# Patient Record
Sex: Male | Born: 1985 | Race: Black or African American | Hispanic: No | Marital: Single | Smoking: Light tobacco smoker
Health system: Southern US, Community
[De-identification: ages and names within clinical notes are randomized; demographics above are authoritative.]

## PROBLEM LIST (undated history)

## (undated) DIAGNOSIS — T7840XA Allergy, unspecified, initial encounter: Secondary | ICD-10-CM

## (undated) HISTORY — PX: FRACTURE SURGERY: SHX138

## (undated) HISTORY — PX: SHOULDER ARTHROSCOPY W/ SUPERIOR LABRAL ANTERIOR POSTERIOR LESION REPAIR: SHX2402

## (undated) HISTORY — PX: TONSILECTOMY, ADENOIDECTOMY, BILATERAL MYRINGOTOMY AND TUBES: SHX2538

## (undated) HISTORY — DX: Allergy, unspecified, initial encounter: T78.40XA

## (undated) HISTORY — PX: KNEE ARTHROSCOPY: SUR90

---

## 2013-02-17 ENCOUNTER — Ambulatory Visit (INDEPENDENT_AMBULATORY_CARE_PROVIDER_SITE_OTHER): Payer: 59 | Admitting: Physician Assistant

## 2013-02-17 VITALS — BP 102/64 | HR 90 | Temp 98.1°F | Resp 16 | Ht 70.0 in | Wt 169.0 lb

## 2013-02-17 DIAGNOSIS — Z111 Encounter for screening for respiratory tuberculosis: Secondary | ICD-10-CM

## 2013-02-17 NOTE — Progress Notes (Signed)
  Subjective:    Patient ID: Shelia Media, male    DOB: 12-Oct-1985, 27 y.o.   MRN: [id number]  HPI   Mr. Boudoin is a very pleasant 27 yr old male here for TB skin test.  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  Has had skin tests in the past, never positive, most recent in 2012.  Pt is in the process of applying to medical school.  Feels well today, no fever, chills, sweats, cough.   Review of Systems  All other systems reviewed and are negative.       Objective:   Physical Exam  Vitals reviewed. Constitutional: He is oriented to person, place, and time. He appears well-developed and well-nourished. No distress.  HENT:  Head: Normocephalic and atraumatic.  Eyes: Conjunctivae are normal. No scleral icterus.  Cardiovascular: Normal rate, regular rhythm and normal heart sounds.  Exam reveals no gallop and no friction rub.   No murmur heard. Pulmonary/Chest: Effort normal and breath sounds normal. He has no wheezes. He has no rales.  Neurological: He is alert and oriented to person, place, and time.  Skin: Skin is warm and dry.  Psychiatric: He has a normal mood and affect. His behavior is normal.     Filed Vitals:   02/17/13 1511  BP: 102/64  Pulse: 90  Temp: 98.1 F (36.7 C)  Resp: 16        Assessment & Plan:  Screening for tuberculosis - Plan: TB Skin Test   Mr. Courville is a very pleasant 27 yr old male here for TB skin test.  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.  TST placed.  Discussed with pt that he will need to RTC in 48-72 hours for read.  Pt expresses understanding and will return.

## 2013-02-17 NOTE — Patient Instructions (Addendum)
Return in 48-72 hours to have your test read.  We are open 8am-6pm both Saturday and Sunday.   Tuberculin Skin Test The PPD skin test is a method used to help with the diagnosis of a disease called tuberculosis (TB). HOW THE TEST IS DONE  The test site (usually the forearm) is cleansed. The PPD extract is then injected under the top layer of skin, causing a blister to form on the skin. The reaction will take 48 - 72 hours to develop. You must return to your health care provider within that time to have the area checked. This will determine whether you have had a significant reaction to the PPD test. A reaction is measured in millimeters of hard swelling (induration) at the site. PREPARATION FOR TEST  There is no special preparation for this test. People with a skin rash or other skin irritations on their arms may need to have the test performed at a different spot on the body. Tell your health care provider if you have ever had a positive PPD skin test. If so, you should not have a repeat PPD test. Tell your doctor if you have a medical condition or if you take certain drugs, such as steroids, that can affect your immune system. These situations may lead to inaccurate test results. NORMAL FINDINGS A negative reaction (no induration) or a level of hard swelling that falls below a certain cutoff may mean that a person has not been infected with the bacteria that cause TB. There are different cutoffs for children, people with HIV, and other risk groups. Unfortunately, this is not a perfect test, and up to 20% of people infected with tuberculosis may not have a reaction on the PPD skin test. In addition, certain conditions that affect the immune system (cancer, recent chemotherapy, late-stage AIDS) may cause a false-negative test result.  The reaction will take 48 - 72 hours to develop. You must return to your health care provider within that time to have the area checked. Follow your caregiver's  instructions as to where and when to report for this to be done. Ranges for normal findings may vary among different laboratories and hospitals. You should always check with your doctor after having lab work or other tests done to discuss the meaning of your test results and whether your values are considered within normal limits. WHAT ABNORMAL RESULTS MEAN  The results of the test depend on the size of the skin reaction and on the person being tested.  A small reaction (5 mm of hard swelling at the site) is considered to be positive in people who have HIV, who are taking steroid therapy, or who have been in close contact with a person who has active tuberculosis. Larger reactions (greater than or equal to 10 mm) are considered positive in people with diabetes or kidney failure, and in health care workers, among others. In people with no known risks for tuberculosis, a positive reaction requires 15 mm or more of hard swelling at the site. RISKS AND COMPLICATIONS There is a very small risk of severe redness and swelling of the arm in people who have had a previous positive PPD test and who have the test again. There also have been a few rare cases of this reaction in people who have not been tested before. CONSIDERATIONS  A positive skin test does not necessarily mean that a person has active tuberculosis. More tests will be done to check whether active disease is present. Many people who  were born outside the [place] may have had a vaccine called "BCG," which can lead to a false-positive test result. MEANING OF TEST  Your caregiver will go over the test results with you and discuss the importance and meaning of your results, as well as treatment options and the need for additional tests if necessary. OBTAINING THE TEST RESULTS It is your responsibility to obtain your test results. Ask the lab or department performing the test when and how you will get your results. Document Released: 07/03/2005  Document Revised: 12/16/2011 Document Reviewed: 09/04/2008 [facility] Patient Information 2013 [place], [place].

## 2013-03-24 ENCOUNTER — Ambulatory Visit (INDEPENDENT_AMBULATORY_CARE_PROVIDER_SITE_OTHER): Payer: 59 | Admitting: Physician Assistant

## 2013-03-24 VITALS — BP 111/79 | HR 76 | Temp 97.5°F | Resp 17 | Ht 68.5 in | Wt 167.0 lb

## 2013-03-24 DIAGNOSIS — Z111 Encounter for screening for respiratory tuberculosis: Secondary | ICD-10-CM

## 2013-03-24 NOTE — Progress Notes (Signed)
  Subjective:    Patient ID: Christian Farmer, male    DOB: 1986/04/24, 27 y.o.   MRN: [id number]  HPI 27 year old male here for TB test. 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 Had one placed in 02/2013 but did not come back to have it read. Will place again today.  Patient otherwise doing well with no other concerns today.     Review of Systems  Constitutional: Negative for fever and chills.  Respiratory: Negative for cough and shortness of breath.   Gastrointestinal: Negative for nausea and vomiting.  Neurological: Negative for headaches.       Objective:   Physical Exam  Constitutional: He is oriented to person, place, and time. He appears well-developed and well-nourished.  HENT:  Head: Normocephalic and atraumatic.  Right Ear: External ear normal.  Left Ear: External ear normal.  Eyes: Conjunctivae are normal.  Cardiovascular: Normal rate.   Pulmonary/Chest: Effort normal.  Neurological: He is alert and oriented to person, place, and time.  Psychiatric: He has a normal mood and affect. His behavior is normal. Judgment and thought content normal.          Assessment & Plan:  Screening for tuberculosis - Plan: TB Skin Test  TB test place. Return in 48-72 hours for read

## 2013-03-24 NOTE — Progress Notes (Signed)
  Tuberculosis Risk Questionnaire  1. Were you born outside the [place] in one of the following parts of the world: [place], [place], [place], [place] or [place]?  No  2. Have you traveled outside the [place] and lived for more than one month in one of the following parts of the world: [place], [place], [place], [place] or [place]?  No  3. Do you have a compromised immune system such as from any of the following conditions:HIV/AIDS, organ or bone marrow transplantation, diabetes, immunosuppressive medicines (e.g. Prednisone, Remicaide), leukemia, lymphoma, cancer of the head or neck, gastrectomy or jejunal bypass, end-stage renal disease (on dialysis), or silicosis?  No   4. Have you ever or do you plan on working in: a residential care center, a health care facility, a jail or prison or homeless shelter?  Yes - plans to attend medical school  5. Have you ever: injected illegal drugs, used crack cocaine, lived in a homeless shelter  or been in jail or prison?   No  6. Have you ever been exposed to anyone with infectious tuberculosis?  No   Tuberculosis Symptom Questionnaire  Do you currently have any of the following symptoms?  1. Unexplained cough lasting more than 3 weeks? No  2. Unexplained fever lasting more than 3 weeks. No   3. Night Sweats (sweating that leaves the bedclothes and sheets wet)   No  4. Shortness of Breath No  5. Chest Pain No  6. Unintentional weight loss  No  7. Unexplained fatigue (very tired for no reason) No

## 2013-03-26 ENCOUNTER — Ambulatory Visit (INDEPENDENT_AMBULATORY_CARE_PROVIDER_SITE_OTHER): Payer: 59 | Admitting: Radiology

## 2013-03-26 VITALS — BP 114/68

## 2013-03-26 DIAGNOSIS — Z111 Encounter for screening for respiratory tuberculosis: Secondary | ICD-10-CM

## 2013-03-26 NOTE — Progress Notes (Signed)
  Tuberculosis Risk Questionnaire  1. Were you born outside the [place] in one of the following parts of the world: [place], [place], [place], [place] or [place]?  No  2. Have you traveled outside the [place] and lived for more than one month in one of the following parts of the world: [place], [place], [place], [place] or Eastern [place]?  No  3. Do you have a compromised immune system such as from any of the following conditions:HIV/AIDS, organ or bone marrow transplantation, diabetes, immunosuppressive medicines (e.g. Prednisone, Remicaide), leukemia, lymphoma, cancer of the head or neck, gastrectomy or jejunal bypass, end-stage renal disease (on dialysis), or silicosis?  No   4. Have you ever or do you plan on working in: a residential care center, a health care facility, a jail or prison or homeless shelter?  Yes   5. Have you ever: injected illegal drugs, used crack cocaine, lived in a homeless shelter  or been in jail or prison?   No  6. Have you ever been exposed to anyone with infectious tuberculosis?  No   Tuberculosis Symptom Questionnaire  Do you currently have any of the following symptoms?  1. Unexplained cough lasting more than 3 weeks? No  2. Unexplained fever lasting more than 3 weeks. No   3. Night Sweats (sweating that leaves the bedclothes and sheets wet)   No  4. Shortness of Breath No  5. Chest Pain No  6. Unintentional weight loss  No  7. Unexplained fatigue (very tired for no reason) No  

## 2013-05-06 ENCOUNTER — Ambulatory Visit: Payer: 59

## 2013-05-06 ENCOUNTER — Ambulatory Visit (INDEPENDENT_AMBULATORY_CARE_PROVIDER_SITE_OTHER): Payer: 59 | Admitting: Internal Medicine

## 2013-05-06 VITALS — BP 112/78 | HR 73 | Temp 98.6°F | Resp 17 | Ht 70.0 in | Wt 159.0 lb

## 2013-05-06 DIAGNOSIS — M25571 Pain in right ankle and joints of right foot: Secondary | ICD-10-CM

## 2013-05-06 DIAGNOSIS — S93609A Unspecified sprain of unspecified foot, initial encounter: Secondary | ICD-10-CM

## 2013-05-06 DIAGNOSIS — M25579 Pain in unspecified ankle and joints of unspecified foot: Secondary | ICD-10-CM

## 2013-05-06 DIAGNOSIS — S96912A Strain of unspecified muscle and tendon at ankle and foot level, left foot, initial encounter: Secondary | ICD-10-CM

## 2013-05-06 MED ORDER — IBUPROFEN 600 MG PO TABS: 600.0000 mg | ORAL_TABLET | Freq: Three times a day (TID) | ORAL | Status: DC | PRN

## 2013-05-06 NOTE — Patient Instructions (Signed)
Foot Fracture Your caregiver has diagnosed you as having a foot fracture (broken bone). Your foot has many bones. You have a fracture, or break, in one of these bones. In some cases, your doctor may put on a splint or removable fracture boot until the swelling in your foot has lessened. A cast may or may not be required. HOME CARE INSTRUCTIONS  If you do not have a cast or splint:  You may bear weight on your injured foot as tolerated or advised.  Do not put any weight on your injured foot for as long as directed by your caregiver. Slowly increase the amount of time you walk on the foot as the pain and swelling allows or as advised.  Use crutches until you can bear weight without pain. A gradual increase in weight bearing may help.  Apply ice to the injury for 15-20 minutes each hour while awake for the first 2 days. Put the ice in a plastic bag and place a towel between the bag of ice and your skin.  If an ace bandage (stretchy, elastic wrapping bandage) was applied, you may re-wrap it if ankle is more painful or your toes become cold and swollen. If you have a cast or splint:  Use your crutches for as long as directed by your caregiver.  To lessen the swelling, keep the injured foot elevated on pillows while lying down or sitting. Elevate your foot above your heart.  Apply ice to the injury for 15-20 minutes each hour while awake for the first 2 days. Put the ice in a plastic bag and place a thin towel between the bag of ice and your cast.  Plaster or fiberglass cast:  Do not try to scratch the skin under the cast using a sharp or pointed object down the cast.  Check the skin around the cast every day. You may put lotion on any red or sore areas.  Keep your cast clean and dry.  Plaster splint:  Wear the splint until you are seen for a follow-up examination.  You may loosen the elastic around the splint if your toes become numb, tingle, or turn blue or cold. Do not rest it on  anything harder than a pillow in the first 24 hours.  Do not put pressure on any part of your splint. Use your crutches as directed.  Keep your splint dry. It can be protected during bathing with a plastic bag. Do not lower the splint into water.  If you have a fracture boot you may remove it to shower. Bear weight only as instructed by your caregiver.  Only take over-the-counter or prescription medicines for pain, discomfort, or fever as directed by your caregiver. SEEK IMMEDIATE MEDICAL CARE IF:   Your cast gets damaged or breaks.  You have continued severe pain or more swelling than you did before the cast was put on.  Your skin or nails of your casted foot turn blue, gray, feel cold or numb.  There is a bad smell from your cast.  There is severe pain with movement of your toes.  There are new stains and/or drainage coming from under the cast. MAKE SURE YOU:   Understand these instructions.  Will watch your condition.  Will get help right away if you are not doing well or get worse. Document Released: 09/20/2000 Document Revised: 12/16/2011 Document Reviewed: 10/27/2008 [employer] Patient Information 2014 [employer].  

## 2013-05-06 NOTE — Progress Notes (Signed)
  Subjective:    Patient ID: Christian Farmer, male    DOB: Jun 01, 1986, 27 y.o.   MRN: [id number]  HPI Pt presents to clinic this morning with Lt foot pain. He does not recall any injury. He states yesterday he was up playing basketball with some friends.   Review of Systems     Objective:   Physical Exam        Assessment & Plan:

## 2013-05-06 NOTE — Progress Notes (Signed)
  Subjective:    Patient ID: Christian Farmer, male    DOB: [birth year], 27 y.o.   MRN: [id number]  HPI Young and healthy and active. @ days ago was aggressively dunking the BB, next am felt pain mid dorsal volar/plantar foot. Is swollen, maybe a little red. No previous injury this foot . Cannot bear weight   Review of Systems Neg.    Objective:   Physical Exam  Vitals reviewed. Constitutional: He is oriented to person, place, and time. He appears well-developed and well-nourished.  HENT:  Nose: Nose normal.  Eyes: EOM are normal.  Pulmonary/Chest: Effort normal.  Musculoskeletal: He exhibits edema and tenderness.       Left foot: He exhibits tenderness, bony tenderness and swelling. He exhibits normal range of motion, normal capillary refill, no crepitus, no deformity and no laceration.  Neurological: He is alert and oriented to person, place, and time. He has normal strength. No sensory deficit. Gait abnormal.    [facility] reading (PRIMARY) by  Dr Raelene Bott fx seen.        Assessment & Plan:  Foot strain/possible stress fx RICE/[facility]walker

## 2013-07-06 ENCOUNTER — Ambulatory Visit (INDEPENDENT_AMBULATORY_CARE_PROVIDER_SITE_OTHER): Payer: 59 | Admitting: Physician Assistant

## 2013-07-06 VITALS — BP 102/64 | HR 78 | Temp 98.1°F | Resp 20 | Ht 66.5 in | Wt 163.6 lb

## 2013-07-06 DIAGNOSIS — Z23 Encounter for immunization: Secondary | ICD-10-CM

## 2013-07-06 DIAGNOSIS — Z8619 Personal history of other infectious and parasitic diseases: Secondary | ICD-10-CM

## 2013-07-06 NOTE — Patient Instructions (Addendum)
Immunization Schedule, Adult  Influenza vaccine.  Adults should be given 1 dose every year.  Tetanus, diphtheria, and pertussis (Td, Tdap) vaccine.  Adults who have not previously been given Tdap or who do not know their vaccine status should be given 1 dose of Tdap.  Adults should have a Td booster every 10 years.  Doses should be given if needed to catch up on missed doses in the past.  Pregnant women should be given 1 dose of Tdap vaccine during each pregnancy.  Varicella vaccine.  All adults without evidence of immunity to varicella should receive 2 doses or a second dose if they have received only 1 dose.  Pregnant women who do not have evidence of immunity should be given the first dose after their pregnancy.  Human papillomavirus (HPV) vaccine.  Women aged 13 through 26 years who have not been given the vaccine previously should be given the 3 dose series. The second dose should be given 1 to 2 months after the first dose. The third dose should be given at least 24 weeks after the first dose.  The vaccine is not recommended for use in pregnant women. However, pregnancy testing is not needed before being given a dose. If a woman is found to be pregnant after being given a dose, no treatment is needed. In that case, the remaining doses should be delayed until after the pregnancy.  Men aged 13 through 21 years who have not been given the vaccine previously should be given the 3 dose series. Men aged 22 through 26 years may be given the 3 dose series. The second dose should be given 1 to 2 months after the first dose. The third dose should be given at least 24 weeks after the first dose.  Zoster vaccine.  One dose is recommended for adults aged 60 years and older unless certain conditions are present.  Measles, mumps, and rubella (MMR) vaccine.  Adults born before 1957 generally are considered immune to measles and mumps. Healthcare workers born before 1957 who do not have  evidence of immunity should consider vaccination.  Adults born in 1957 or later should have 1 or more doses of MMR vaccine unless there is a contraindication for the vaccine or they have evidence of immunity to the diseases. A second dose should be given at least 28 days after the first dose. Adults receiving certain types of previous vaccines should consider or be given vaccine doses.  For women of childbearing age, rubella immunity should be determined. If there is no evidence of immunity, women who are not pregnant should be vaccinated. If there is no evidence of immunity, women who are pregnant should delay vaccination until after their pregnancy.  Pneumococcal polysaccharide (PPSV23) vaccine.  All adults aged 65 years and older should be given 1 dose.  Adults younger than age 65 years who have certain medical conditions, who smoke cigarettes, who reside in nursing homes or long-term care facilities, or who have an unknown vaccination history should usually be given 1 or 2 doses of the vaccine.  Pneumococcal 13-valent conjugate (PVC13) vaccine.  Adults aged 19 years or older with certain medical conditions and an unknown or incomplete pneumococcal vaccination history should usually be given 1 dose of the vaccine. This dose may be in addition to a PPSV23 vaccine dose.  Meningococcal vaccine.  First-year college students up to age 21 years who are living in residence halls should be given a dose if they did not receive a dose on   or after their 16th birthday.  A dose should be given to microbiologists working with certain meningitis bacteria, military recruits, and people who travel to or live in countries with a high rate of meningitis.  One or 2 doses should be given to adults who have certain high-risk conditions.  Hepatitis A vaccine.  Adults who wish to be protected from this disease, who have certain high-risk conditions, who work with hepatitis A-infected animals, who work in  hepatitis A research labs, or who travel to or work in countries with a high rate of hepatitis A should be given the 2 dose series of the vaccine.  Adults who were previously unvaccinated and who anticipate close contact with an international adoptee during the first 60 days after arrival in the [place] from a country with a high rate of hepatitis A should be given the vaccine. The first dose of the 2 dose series should be given 2 or more weeks before the arrival of the adoptee.  Hepatitis B vaccine.  Adults who wish to be protected from this disease, who have certain high-risk conditions, who may be exposed to blood or other infectious body fluids, who are household contacts or sex partners of hepatitis B positive people, who are clients or workers in certain care facilities, or who travel to or work in countries with a high rate of hepatitis B should be given the 3 dose series of the vaccine. If you travel outside the [place], additional vaccines may be needed. The Centers for Disease Control and Prevention (CDC) provides information about the vaccines, medicines, and other measures necessary to prevent illness and injury during international travel. Visit the CDC website at www.cdc.gov/travel or call (800) CDC-INFO [800-232-4636]. You may also consult a travel clinic or your caregiver. Document Released: 12/14/2003 Document Revised: 12/16/2011 Document Reviewed: 11/08/2011 [employer] Patient Information 2014 [employer].  

## 2013-07-06 NOTE — Progress Notes (Signed)
  Subjective:    Patient ID: Christian Farmer, male    DOB: 1985-10-09, 27 y.o.   MRN: [id number]  HPI   Christian Farmer is a very pleasant 27 yr old male here requesting a varicella titer.  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  Had chicken pox as a child.  Has never had the vaccine.  Feels well today.     Review of Systems  All other systems reviewed and are negative.       Objective:   Physical Exam  Vitals reviewed. Constitutional: He is oriented to person, place, and time. He appears well-developed and well-nourished. No distress.  HENT:  Head: Normocephalic and atraumatic.  Eyes: Conjunctivae are normal. No scleral icterus.  Cardiovascular: Normal rate, regular rhythm and normal heart sounds.   Pulmonary/Chest: Effort normal and breath sounds normal. He has no wheezes. He has no rales.  Neurological: He is alert and oriented to person, place, and time.  Skin: Skin is warm and dry.  Psychiatric: He has a normal mood and affect. His behavior is normal.        Assessment & Plan:  History of varicella - Plan: Varicella zoster antibody, IgG  Need for varicella vaccine - Plan: Varicella zoster antibody, IgG   Christian Farmer is a pleasant 27 yr old male here for varicella titer for job shadowing.  Labs drawn.  Will provide results to pt when available.

## 2013-07-07 LAB — VARICELLA ZOSTER ANTIBODY, IGG: Varicella IgG: 238.6 Index — ABNORMAL HIGH (ref <135.00)

## 2013-07-08 ENCOUNTER — Telehealth: Payer: Self-pay

## 2013-07-08 NOTE — Telephone Encounter (Signed)
Lab has contacted him.

## 2013-07-08 NOTE — Telephone Encounter (Signed)
Pt is calling to get his lab results  Call back number is [phone]

## 2014-01-25 ENCOUNTER — Ambulatory Visit (INDEPENDENT_AMBULATORY_CARE_PROVIDER_SITE_OTHER): Payer: 59 | Admitting: Physician Assistant

## 2014-01-25 VITALS — BP 118/62 | HR 75 | Temp 98.0°F | Resp 16 | Ht 66.5 in | Wt 156.8 lb

## 2014-01-25 DIAGNOSIS — E291 Testicular hypofunction: Secondary | ICD-10-CM

## 2014-01-25 NOTE — Patient Instructions (Signed)
I will let you know when your testosterone level is back and when I have records from Dr. Karen Kitchensrujillo, then we will come up with a plan from there  Please let me know if you have any concerns in the mean time

## 2014-01-25 NOTE — Progress Notes (Addendum)
   Subjective:    Patient ID: Christian Farmer, male    DOB: 1986-09-21, 28 y.o.   MRN: [id number]  HPI   Christian Farmer is a very pleasant 28 yr old male here to discuss hypogonadism.  He reports a history of this diagnosed when he was about 17 due to late onset of puberty, low muscle mass.  [place] [place] [place] [facility]  Reports that his testosterone was last tested about 2007 - he moved away to go to college and was lost to follow up - has been untreated since that time.  He is in the [employer] and was undergoing a medical evaluation this past week and was recommended to have labs checked  Pt reports he is working out and trying to gain weight but cannot put on any muscle mass.  He reports low bone density - last tested when seeing Dr. Karen Kitchensrujillo.  He reports he is often tired - but thinks this is due to school.  He has normal energy.  He does monthly testicular exams and has no concerns.   Review of Systems  Constitutional: Negative.   Respiratory: Negative.   Cardiovascular: Negative.   Genitourinary: Negative.   Musculoskeletal: Negative.        Objective:   Physical Exam  Vitals reviewed. Constitutional: He is oriented to person, place, and time. He appears well-developed and well-nourished. No distress.  HENT:  Head: Normocephalic and atraumatic.  Eyes: Conjunctivae are normal. No scleral icterus.  Cardiovascular: Normal rate, regular rhythm and normal heart sounds.   Pulmonary/Chest: Effort normal and breath sounds normal. He has no wheezes. He has no rales.  Neurological: He is alert and oriented to person, place, and time.  Skin: Skin is warm and dry.  Psychiatric: He has a normal mood and affect. His behavior is normal.       Assessment & Plan:  Hypogonadism male - Plan: Testosterone, free, total   Christian Farmer is a very pleasant 28 yr old male here for evaluation of hypogonadism.  He reports this was diagnosed about 10 yrs ago due to late onset of puberty.   He [place] [facility] [place] but was lost to follow up several years ago.  He has not been treated or had labs drawn since that time.  Will check total testosterone today.  We have requested records from Dr. Karen Kitchensrujillo.  I suspect pt will need to re-initiate testosterone therapy, but will await labs and records before making a decision on treatment.    Loleta DickerE. Elizabeth Kerby Hockley MHS, PA-C [facility] 4/21/20151:31 PM   Addendum 01/26/14: Testosterone is actually WNL at 525, though this is on the lower end.  Will recheck this in about 1 wk as levels do very . Awaiting records from Dr. Karen Kitchensrujillo

## 2014-01-26 LAB — TESTOSTERONE, FREE, TOTAL, SHBG
Sex Hormone Binding: 46 nmol/L (ref 13–71)
TESTOSTERONE: 525 ng/dL (ref 300–890)
Testosterone, Free: 89.6 pg/mL (ref 47.0–244.0)
Testosterone-% Free: 1.7 % (ref 1.6–2.9)

## 2014-01-26 NOTE — Addendum Note (Signed)
Addended by: Godfrey PickEGAN, Conan Mcmanaway E on: 01/26/2014 06:17 PM   Modules accepted: Orders

## 2014-02-07 ENCOUNTER — Other Ambulatory Visit (INDEPENDENT_AMBULATORY_CARE_PROVIDER_SITE_OTHER): Payer: 59

## 2014-02-07 DIAGNOSIS — E291 Testicular hypofunction: Secondary | ICD-10-CM

## 2014-02-07 NOTE — Progress Notes (Signed)
Patient here for labs only. 

## 2014-02-08 ENCOUNTER — Other Ambulatory Visit: Payer: Self-pay | Admitting: Physician Assistant

## 2014-02-08 DIAGNOSIS — E291 Testicular hypofunction: Secondary | ICD-10-CM

## 2014-02-08 LAB — TESTOSTERONE, FREE, TOTAL, SHBG
Sex Hormone Binding: 33 nmol/L (ref 13–71)
TESTOSTERONE FREE: 51.5 pg/mL (ref 47.0–244.0)
TESTOSTERONE-% FREE: 1.9 % (ref 1.6–2.9)
TESTOSTERONE: 265 ng/dL — AB (ref 300–890)

## 2014-02-08 MED ORDER — TESTOSTERONE 20.25 MG/1.25GM (1.62%) TD GEL: TRANSDERMAL | Status: DC

## 2014-02-08 NOTE — Progress Notes (Signed)
Pt with hx hypogonadism, untreated for several years now.  Two AM testosterone 525 and 265.  Will restart Androgel, which pt has been treated with before.  Start 2 pumps daily.  Recheck testosterone in 1 month - future order placed.  Lab Results  Component Value Date   TESTOSTERONE 265* 02/07/2014

## 2014-03-21 ENCOUNTER — Other Ambulatory Visit: Payer: Self-pay | Admitting: Physician Assistant

## 2014-03-25 ENCOUNTER — Telehealth: Payer: Self-pay

## 2014-03-25 DIAGNOSIS — E291 Testicular hypofunction: Secondary | ICD-10-CM

## 2014-03-25 NOTE — Telephone Encounter (Signed)
Patient called and needs a refill on his Androgel. Informed patient that he needs an OV for a recheck on lab work before he can receive another refill. Patient understood however he asked that a leave a message for him to get an extension on this medication until hie is able to come in. Patient uses [facility] on [address]  [phone]

## 2014-03-29 MED ORDER — TESTOSTERONE 20.25 MG/1.25GM (1.62%) TD GEL: TRANSDERMAL | Status: DC

## 2014-03-29 NOTE — Telephone Encounter (Signed)
Advised pt rx ready to be picked up. Pt will come to have labs drawn before he runs out.

## 2014-03-29 NOTE — Telephone Encounter (Signed)
Rx printed.  Pt needs to come in for lab draw. There is a future order in for this, so he can come at his convenience.  No further refills though until we check his testosterone

## 2014-05-09 IMAGING — CR DG FOOT COMPLETE 3+V*L*
2 series · 2 of 2 positions shown · non-contrast
Comparison: None.

CLINICAL DATA: Painful left foot.  No known injury.

LEFT FOOT - COMPLETE 3+ VIEW

[AP]
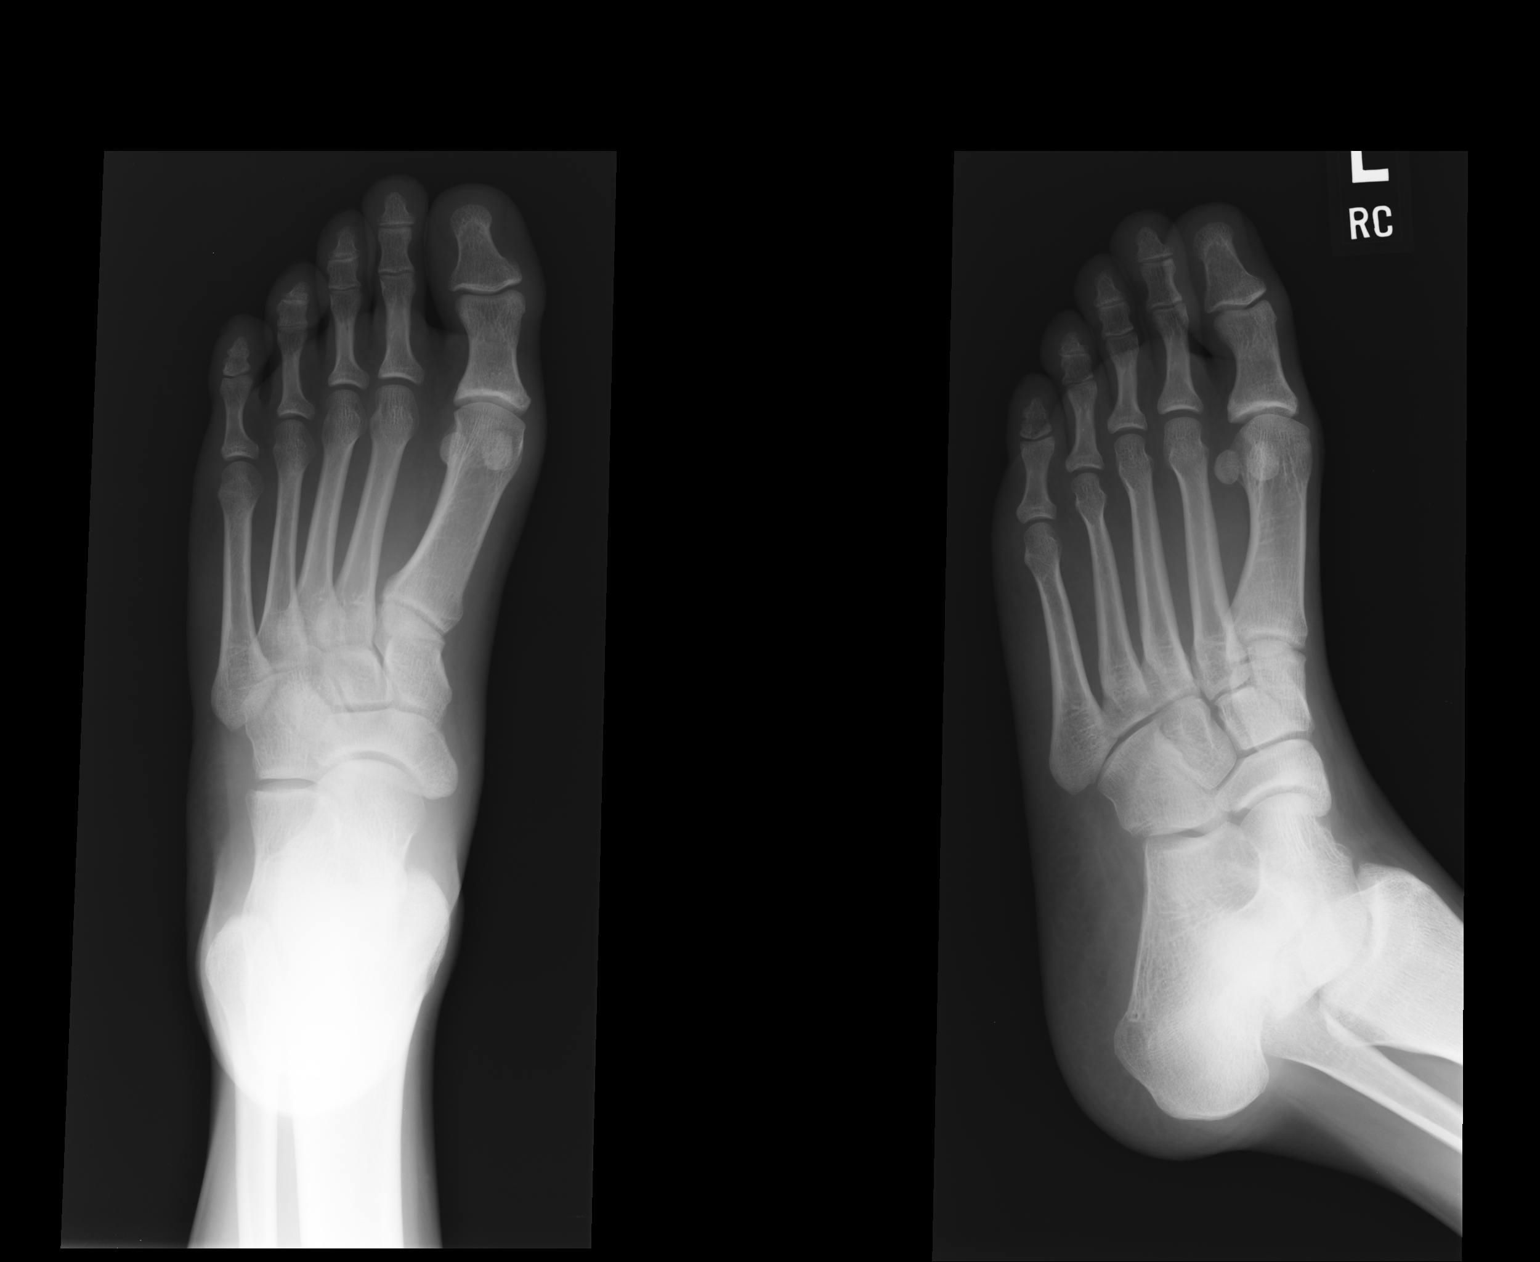

[lateral]
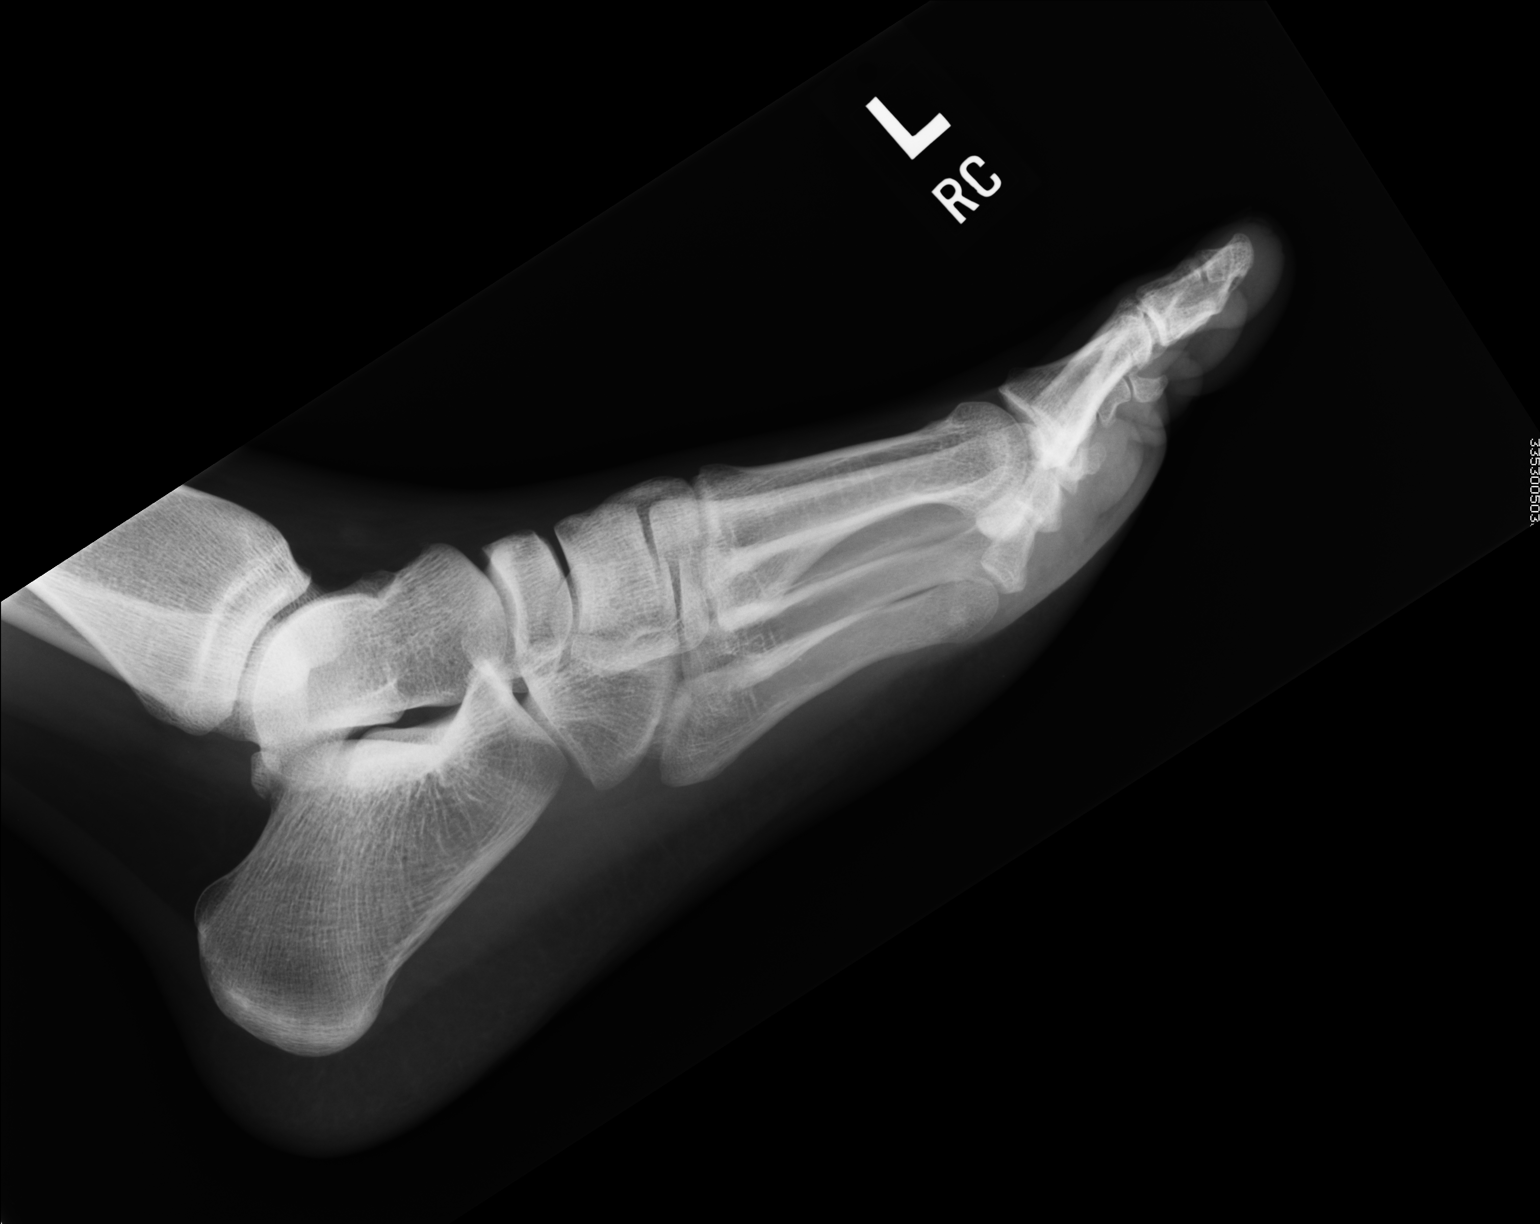

[2 of 2 positions shown; findings below may reference images not displayed]

FINDINGS: Alignment is normal.  Joint spaces are preserved.  No
fracture or dislocation is evident.  No soft tissue lesions are
seen.
IMPRESSION: No foot abnormality is evident.

Clinically significant discrepancy from primary report, if
provided: None

## 2014-05-18 ENCOUNTER — Other Ambulatory Visit: Payer: Self-pay | Admitting: *Deleted

## 2014-07-06 ENCOUNTER — Ambulatory Visit (INDEPENDENT_AMBULATORY_CARE_PROVIDER_SITE_OTHER): Payer: 59 | Admitting: Family Medicine

## 2014-07-06 VITALS — BP 112/68 | HR 70 | Temp 98.2°F | Resp 16 | Ht 70.0 in | Wt 162.0 lb

## 2014-07-06 DIAGNOSIS — E291 Testicular hypofunction: Secondary | ICD-10-CM

## 2014-07-06 DIAGNOSIS — Z8639 Personal history of other endocrine, nutritional and metabolic disease: Secondary | ICD-10-CM

## 2014-07-06 DIAGNOSIS — Z862 Personal history of diseases of the blood and blood-forming organs and certain disorders involving the immune mechanism: Secondary | ICD-10-CM

## 2014-07-06 LAB — FSH/LH
FSH: 4 m[IU]/mL (ref 1.4–18.1)
LH: 3.9 m[IU]/mL (ref 1.5–9.3)

## 2014-07-06 NOTE — Patient Instructions (Signed)
After we get to reports we will represcribed your medications. If I have any questions or concerns I will refer you to an endocrinologist.

## 2014-07-06 NOTE — Addendum Note (Signed)
Addended byAlden Benjamin: Bethanny Toelle R on: 07/06/2014 11:11 AM   Modules accepted: Orders

## 2014-07-06 NOTE — Progress Notes (Addendum)
Subjective: Patient has a history of hypogonadism. He was treated beginning in high school. His parents got divorced history discontinue. He was in the [employer] and never got this reinitiated. He came in this spring and was seen. He has had 2 levels checked, one of which was in the mid normal range at 1 which was low. He is here to get his testosterone prescribed. We had a long talk about that. He is planning to go to medical school.  Objective He has fairly small testicles. Apparently he is able to function sexually. No other major symptoms. He says he has a hard time building muscle bulk than he thinks he should.  Assessment: Hypogonadism Testosterone is some  Plan: FSH LH and testosterone levels. I wish to see these before every prescribe it. I will not be the original prescriber of it since he is been previously diagnosed, and am willing to continue it. However if I see any items of significant concern I will want to refer him to an endocrinologist for further evaluation. He understands these.

## 2014-07-07 ENCOUNTER — Other Ambulatory Visit: Payer: Self-pay | Admitting: Family Medicine

## 2014-07-07 DIAGNOSIS — E291 Testicular hypofunction: Secondary | ICD-10-CM

## 2014-07-07 LAB — TESTOSTERONE, FREE, TOTAL, SHBG
Sex Hormone Binding: 37 nmol/L (ref 13–71)
TESTOSTERONE-% FREE: 1.8 % (ref 1.6–2.9)
Testosterone, Free: 42.1 pg/mL — ABNORMAL LOW (ref 47.0–244.0)
Testosterone: 235 ng/dL — ABNORMAL LOW (ref 300–890)

## 2014-07-07 MED ORDER — TESTOSTERONE 20.25 MG/1.25GM (1.62%) TD GEL: TRANSDERMAL | Status: DC

## 2014-07-08 ENCOUNTER — Other Ambulatory Visit: Payer: Self-pay | Admitting: *Deleted

## 2014-07-08 DIAGNOSIS — R7989 Other specified abnormal findings of blood chemistry: Secondary | ICD-10-CM

## 2014-07-19 ENCOUNTER — Encounter: Payer: Self-pay | Admitting: Endocrinology

## 2014-07-19 ENCOUNTER — Ambulatory Visit (INDEPENDENT_AMBULATORY_CARE_PROVIDER_SITE_OTHER): Payer: 59 | Admitting: Endocrinology

## 2014-07-19 VITALS — BP 122/78 | HR 64 | Temp 98.4°F | Ht 70.0 in | Wt 168.0 lb

## 2014-07-19 DIAGNOSIS — E291 Testicular hypofunction: Secondary | ICD-10-CM

## 2014-07-19 LAB — FOLLICLE STIMULATING HORMONE: FSH: 4.4 m[IU]/mL (ref 1.4–18.1)

## 2014-07-19 LAB — TSH: TSH: 1.24 u[IU]/mL (ref 0.35–4.50)

## 2014-07-19 LAB — LIPID PANEL
CHOLESTEROL: 145 mg/dL (ref 0–200)
HDL: 54.9 mg/dL (ref >39.00)
LDL Cholesterol: 66 mg/dL (ref 0–99)
NonHDL: 90.1
TRIGLYCERIDES: 123 mg/dL (ref 0.0–149.0)
Total CHOL/HDL Ratio: 3
VLDL: 24.6 mg/dL (ref 0.0–40.0)

## 2014-07-19 LAB — TESTOSTERONE: TESTOSTERONE: 391.94 ng/dL (ref 300.00–890.00)

## 2014-07-19 LAB — T4, FREE: FREE T4: 0.87 ng/dL (ref 0.60–1.60)

## 2014-07-19 LAB — LUTEINIZING HORMONE: LH: 3.45 m[IU]/mL (ref 1.50–9.30)

## 2014-07-19 NOTE — Progress Notes (Signed)
Subjective:    Patient ID: Christian Farmer, male    DOB: October 29, 1985, 28 y.o.   MRN: [id number]  HPI Pt had a normal gestation and delivery.  He had the usual childhood illness only.  He met developmental milestones.  He did well at school.   He has never had the following: thyroid problems, renal disease,  ADHD, jaundice, diabetes, or scoliosis.  He had a mild fx of his right ankle at age 25.  No assoc osteoporosis. Pt reports he had puberty at the normal age, but he was noted to have a low testosterone at age 22.  He is unaware of any workup ever having been done.  He has no biological children now, but wishes to have children eventually.  He says he has never taken illicit androgens.  He has been on prescribed medication for hypogonadism off and x 9 years, but has been off x 3 months.  He denies any h/o infertility.  He has never had surgery, or a serious injury to the head or genital area.   Past Medical History  Diagnosis Date  . Allergy     Past Surgical History  Procedure Laterality Date  . Fracture surgery    . Tonsilectomy, adenoidectomy, bilateral myringotomy and tubes      History   Social History  . Marital Status: Single    Spouse Name: N/A    Number of Children: N/A  . Years of Education: N/A   Occupational History  . Not on file.   Social History Main Topics  . Smoking status: Former Research scientist (life sciences)  . Smokeless tobacco: Not on file  . Alcohol Use: Yes  . Drug Use: No  . Sexual Activity: Yes    Birth Control/ Protection: Condom   Other Topics Concern  . Not on file   Social History Narrative  . No narrative on file    Current Outpatient Prescriptions on File Prior to Visit  Medication Sig Dispense Refill  . Testosterone 20.25 MG/1.25GM (1.62%) GEL Place 2 pumps onto the skin daily  1.25 g  2   No current facility-administered medications on file prior to visit.    Allergies  Allergen Reactions  . Banana     Family History  Problem Relation Age of Onset    . Diabetes Mother   . Hypertension Mother   . Hypertension Father   . Diabetes Maternal Grandmother   .     no hypogonadism  BP 122/78  Pulse 64  Temp(Src) 98.4 F (36.9 C) (Oral)  Ht _0  (1.778 m)  Wt 168 lb (76.204 kg)  BMI 24.11 kg/m2  SpO2 98%   Review of Systems denies weight change, headache, fever, diarrhea, rash, visual loss, abdominal pain, sob, depression, ED sxs, gynecomastia, cramps, excessive diaphoresis, cold intolerance, muscle weakness, n/v, rhinorrhea, easy bruising, and numbness.      Objective:   Physical Exam VS: see vs page GEN: no distress HEAD: head: no deformity eyes: no periorbital swelling, no proptosis external nose and ears are normal mouth: no lesion seen NECK: supple, thyroid is not enlarged CHEST WALL: no deformity LUNGS: clear to auscultation BREASTS:  No gynecomastia CV: reg rate and rhythm, no murmur ABD: abdomen is soft, nontender.  no hepatosplenomegaly.  not distended.  no hernia GENITALIA:  Normal male, except testes are slightly small.   MUSCULOSKELETAL: muscle bulk and strength are grossly normal.  no obvious joint swelling.  gait is normal and steady EXTEMITIES: no deformity.  no ulcer on  the feet.  feet are of normal color and temp.  no edema.  PULSES: dorsalis pedis intact bilat.  no carotid bruit.   NEURO:  cn 2-12 grossly intact.   readily moves all 4's.  sensation is intact to touch on the feet.   SKIN:  Normal texture and temperature.  No rash or suspicious lesion is visible.  Normal hair distribution.   NODES:  None palpable at the neck.   PSYCH: alert, well-oriented.  Does not appear anxious nor depressed.    Lab Results  Component Value Date   TESTOSTERONE 391.94 07/19/2014   i have reviewed the following old records: Office notes.  Radiol: foot x-ray: no osteoporosis.     Assessment & Plan:  Hypogonadism, new to me.  Resolved.  Patient is advised the following: Patient Instructions  normalization of  testosterone is not known to harm you.  however, there are "theoretical" risks, including increased fertility, hair loss, prostate cancer, benign prostate enlargement, blood clots, liver problems, lower hdl ("good cholesterol"), sleep apnea, and behavior changes.  blood tests are being requested for you today.  We'll contact you with results.    addendum: no rx needed.

## 2014-07-19 NOTE — Patient Instructions (Addendum)
normalization of testosterone is not known to harm you.  however, there are "theoretical" risks, including increased fertility, hair loss, prostate cancer, benign prostate enlargement, blood clots, liver problems, lower hdl ("good cholesterol"), sleep apnea, and behavior changes.  blood tests are being requested for you today.  We'll contact you with results.

## 2014-07-20 LAB — PROLACTIN: Prolactin: 10.1 ng/mL (ref 2.1–17.1)

## 2014-07-26 ENCOUNTER — Other Ambulatory Visit (HOSPITAL_COMMUNITY): Payer: Self-pay | Admitting: Oral Surgery

## 2014-07-26 ENCOUNTER — Other Ambulatory Visit: Payer: Self-pay | Admitting: Oral Surgery

## 2014-07-26 DIAGNOSIS — M278 Other specified diseases of jaws: Secondary | ICD-10-CM

## 2014-07-28 ENCOUNTER — Other Ambulatory Visit: Payer: Self-pay | Admitting: Oral Surgery

## 2014-07-28 DIAGNOSIS — M278 Other specified diseases of jaws: Secondary | ICD-10-CM

## 2014-07-29 ENCOUNTER — Inpatient Hospital Stay: Admission: RE | Admit: 2014-07-29 | Payer: 59 | Source: Ambulatory Visit

## 2014-07-29 ENCOUNTER — Other Ambulatory Visit: Payer: 59

## 2014-08-04 ENCOUNTER — Encounter (HOSPITAL_COMMUNITY)
Admission: RE | Admit: 2014-08-04 | Discharge: 2014-08-04 | Disposition: A | Discharge status: Home / Self Care | Payer: 59 | Source: Ambulatory Visit | Attending: Oral Surgery | Admitting: Oral Surgery

## 2014-08-04 DIAGNOSIS — M278 Other specified diseases of jaws: Secondary | ICD-10-CM | POA: Insufficient documentation

## 2014-08-04 MED ORDER — TECHNETIUM TC 99M MEDRONATE IV KIT
25.0000 | PACK | Freq: Once | INTRAVENOUS | Status: AC | PRN
  Administered 2014-08-04: 25 via INTRAVENOUS

## 2014-09-09 ENCOUNTER — Ambulatory Visit: Payer: 59

## 2014-10-07 ENCOUNTER — Ambulatory Visit (INDEPENDENT_AMBULATORY_CARE_PROVIDER_SITE_OTHER): Payer: 59 | Admitting: Physician Assistant

## 2014-10-07 VITALS — BP 108/68 | HR 87 | Temp 98.3°F | Resp 16 | Ht 68.25 in | Wt 166.0 lb

## 2014-10-07 DIAGNOSIS — J019 Acute sinusitis, unspecified: Secondary | ICD-10-CM

## 2014-10-07 MED ORDER — AMOXICILLIN-POT CLAVULANATE 875-125 MG PO TABS: 1.0000 | ORAL_TABLET | Freq: Two times a day (BID) | ORAL | Status: AC

## 2014-10-07 MED ORDER — IPRATROPIUM BROMIDE 0.03 % NA SOLN: 2.0000 | Freq: Two times a day (BID) | NASAL | Status: AC

## 2014-10-07 MED ORDER — GUAIFENESIN ER 1200 MG PO TB12: 1.0000 | ORAL_TABLET | Freq: Two times a day (BID) | ORAL | Status: DC | PRN

## 2014-10-07 MED ORDER — HYDROCOD POLST-CHLORPHEN POLST 10-8 MG/5ML PO LQCR: 5.0000 mL | Freq: Two times a day (BID) | ORAL | Status: DC | PRN

## 2014-10-07 NOTE — Progress Notes (Signed)
Subjective:    Patient ID: Christian Farmer, male    DOB: 1986/05/23, 29 y.o.   MRN: [id number]   PCP: Pricilla Loveless, MD  Chief Complaint  Patient presents with  . Cough    x 4 to 5 days.  coughing up yellow phlem.  running a fever.  was sick around the first of December and now the symptoms have returned  . Nasal Congestion  . Neck Pain    Allergies  Allergen Reactions  . Banana     Patient Active Problem List   Diagnosis Date Noted  . Hypogonadism male 07/19/2014    Prior to Admission medications   Medication Sig Start Date End Date Taking? Authorizing Provider  Phenylephrine-Pheniramine-DM [facility] COLD & COUGH PO) Take by mouth.   Yes Historical Provider, MD  Pseudoeph-Doxylamine-DM-APAP (NYQUIL PO) Take by mouth.   Yes Historical Provider, MD  Pseudoephedrine-APAP-DM (DAYQUIL PO) Take by mouth.   Yes Historical Provider, MD    Medical, Surgical, Family and Social History reviewed and updated.  HPI  Presents with month-long illness, worse x 4-5 days.  He's had nasal congestion, sinus pressure and cough since 12/01.  Some HA and subjective fever. Used a variety of OTC cough and cold products, and seemed to do ok, but then worsened 4-5 days ago.  Nasal drainage and cough sputum are yellow. No HA or fever. Neck feels stiff, not painful. He can move it about without difficulty and denies photophobia. No CP or SOB.  Review of Systems As above.    Objective:   Physical Exam  Constitutional: He is oriented to person, place, and time. Vital signs are normal. He appears well-developed and well-nourished. He is active and cooperative. No distress.  BP 108/68 mmHg  Pulse 87  Temp(Src) 98.3 F (36.8 C) (Oral)  Resp 16  Ht 5' 8.25" (1.734 m)  Wt 166 lb (75.297 kg)  BMI 25.04 kg/m2  SpO2 98%   HENT:  Head: Normocephalic and atraumatic.  Right Ear: Hearing, tympanic membrane, external ear and ear canal normal.  Left Ear: Hearing, tympanic membrane, external ear  and ear canal normal.  Nose: Mucosal edema and rhinorrhea present.  No foreign bodies. Right sinus exhibits no maxillary sinus tenderness and no frontal sinus tenderness. Left sinus exhibits no maxillary sinus tenderness and no frontal sinus tenderness.  Mouth/Throat: Uvula is midline, oropharynx is clear and moist and mucous membranes are normal. No uvula swelling. No oropharyngeal exudate.  Eyes: Conjunctivae and EOM are normal. Pupils are equal, round, and reactive to light. Right eye exhibits no discharge. Left eye exhibits no discharge. No scleral icterus.  Neck: Trachea normal, normal range of motion and full passive range of motion without pain. Neck supple. No thyroid mass and no thyromegaly present.  Cardiovascular: Normal rate, regular rhythm and normal heart sounds.   Pulmonary/Chest: Effort normal and breath sounds normal.  Lymphadenopathy:       Head (right side): No submandibular, no tonsillar, no preauricular, no posterior auricular and no occipital adenopathy present.       Head (left side): No submandibular, no tonsillar, no preauricular and no occipital adenopathy present.    He has no cervical adenopathy.       Right: No supraclavicular adenopathy present.       Left: No supraclavicular adenopathy present.  Neurological: He is alert and oriented to person, place, and time. He has normal strength. No cranial nerve deficit or sensory deficit.  Skin: Skin is warm, dry and intact. No  rash noted.  Psychiatric: He has a normal mood and affect. His speech is normal and behavior is normal.          Assessment & Plan:  1. Subacute sinusitis, unspecified location Stop decongestants. Counseled to use caution with multiple OTC products as they often contain the same products and can result in serious adverse effects from inadvertent overdosing. Supportive care. Anticipatory guidance. RTC PRN. - Guaifenesin (MUCINEX MAXIMUM STRENGTH) 1200 MG TB12; Take 1 tablet (1,200 mg total) by  mouth every 12 (twelve) hours as needed.  Dispense: 14 tablet; Refill: 1 - ipratropium (ATROVENT) 0.03 % nasal spray; Place 2 sprays into both nostrils 2 (two) times daily.  Dispense: 30 mL; Refill: 0 - amoxicillin-clavulanate (AUGMENTIN) 875-125 MG per tablet; Take 1 tablet by mouth 2 (two) times daily.  Dispense: 20 tablet; Refill: 0 - chlorpheniramine-HYDROcodone (TUSSIONEX PENNKINETIC ER) 10-8 MG/5ML LQCR; Take 5 mLs by mouth every 12 (twelve) hours as needed for cough (cough).  Dispense: 100 mL; Refill: 0   Harless Molinari S. Parthiv Mucci, PA-C Physician [facility]

## 2014-10-07 NOTE — Patient Instructions (Signed)
Stop the decongestant containing products. Rest. Drink LOTS of water.

## 2014-10-08 ENCOUNTER — Ambulatory Visit (INDEPENDENT_AMBULATORY_CARE_PROVIDER_SITE_OTHER): Payer: 59 | Admitting: Physician Assistant

## 2014-10-08 VITALS — BP 114/86 | HR 66 | Temp 98.4°F | Resp 16 | Ht 68.0 in | Wt 164.0 lb

## 2014-10-08 DIAGNOSIS — Z23 Encounter for immunization: Secondary | ICD-10-CM

## 2014-10-08 DIAGNOSIS — S0191XA Laceration without foreign body of unspecified part of head, initial encounter: Secondary | ICD-10-CM

## 2014-10-08 NOTE — Progress Notes (Signed)
   Subjective:    Patient ID: Christian Farmer, male    DOB: 01/21/1986, 29 y.o.   MRN: [id number]  HPI Patient presents for scalp laceration he sustained last night at a trampoline park when going to dunk a basketball and hit his head on a basketball goal. Felt "stunned" initially, but denies loss of consciousness, loss of motor/sensory function, dizziness, or lightheadedness. Head is pounding in area of laceration. Was not drinking at the time, but did later that night without consequence. Denies medication or illicit drug use.    Review of Systems  Constitutional: Negative for fever and activity change.  HENT: Negative for tinnitus.   Eyes: Negative for visual disturbance.  Musculoskeletal: Negative for back pain, gait problem and neck pain.  Neurological: Positive for headaches. Negative for dizziness, seizures, syncope, weakness and light-headedness.       Objective:   Physical Exam  Constitutional: He is oriented to person, place, and time. He appears well-developed and well-nourished. No distress.  Blood pressure 114/86, pulse 66, temperature 98.4 F (36.9 C), resp. rate 16, height  (1.727 m), weight 164 lb (74.39 kg), SpO2 100 %.  HENT:  Head: Normocephalic and atraumatic.  Right Ear: External ear normal.  Left Ear: External ear normal.  Eyes: Conjunctivae and EOM are normal. Pupils are equal, round, and reactive to light. Right eye exhibits no discharge. Left eye exhibits no discharge.  Neck: Normal range of motion. Neck supple. No thyromegaly present.  Cardiovascular: Normal rate, regular rhythm and normal heart sounds.  Exam reveals no gallop and no friction rub.   No murmur heard. Pulmonary/Chest: Effort normal and breath sounds normal. He has no wheezes. He has no rhonchi. He has no rales.  Musculoskeletal: Normal range of motion. He exhibits no edema or tenderness.  Lymphadenopathy:    He has no cervical adenopathy.  Neurological: He is alert and oriented to person,  place, and time. He has normal strength and normal reflexes. No cranial nerve deficit or sensory deficit. He exhibits normal muscle tone. Coordination normal.  Skin: Skin is warm and dry. No rash noted. He is not diaphoretic. No erythema. No pallor.  Psychiatric: He has a normal mood and affect. His behavior is normal. Judgment and thought content normal.   Procedure Consent obtained. Wound cleaned with soap and water. 1cc 1% lido for local anesthesia. Wound debrided. #3 4-0 prolene sutures placed. Wound cleaned. Care instructions given.    Assessment & Plan:  1. Laceration of head, initial encounter Ibuprofen for pain prn.  Sutures placed. RTC in 7 days for suture removal. - Tdap vaccine greater than or equal to 7yo IM   Janan Ridge PA-C  [facility] 10/08/2014 9:58 AM

## 2014-10-08 NOTE — Patient Instructions (Signed)

## 2014-10-15 ENCOUNTER — Ambulatory Visit (INDEPENDENT_AMBULATORY_CARE_PROVIDER_SITE_OTHER): Payer: 59 | Admitting: Emergency Medicine

## 2014-10-15 VITALS — BP 118/76 | HR 97 | Resp 16

## 2014-10-15 DIAGNOSIS — Z4802 Encounter for removal of sutures: Secondary | ICD-10-CM

## 2014-10-15 NOTE — Progress Notes (Signed)
[facility] [address], [place] [address] [phone]  Date:  10/15/2014   Name:  Christian SomCalvin Frontera   DOB:  [birth year]   MRN:  [id number]  PCP:  Pricilla LovelessJENSEN, BRIAN C, MD    Chief Complaint: Suture / Staple Removal   History of Present Illness:  Christian Farmer is a 29 y.o. very pleasant male patient who presents with the following:  Sutured scalp wound now here for removal Interval history negative   Patient Active Problem List   Diagnosis Date Noted  . Hypogonadism male 07/19/2014    Past Medical History  Diagnosis Date  . Allergy     Past Surgical History  Procedure Laterality Date  . Tonsilectomy, adenoidectomy, bilateral myringotomy and tubes    . Fracture surgery Right     ankle  . Knee arthroscopy Left   . Shoulder arthroscopy w/ superior labral anterior posterior lesion repair Right     History  Substance Use Topics  . Smoking status: Light Tobacco Smoker  . Smokeless tobacco: Current User     Comment: intermittent dip user (only on [employer])  . Alcohol Use: 3.0 - 6.0 oz/week    5-10 Not specified per week    Family History  Problem Relation Age of Onset  . Diabetes Mother   . Hypertension Mother   . Hypertension Father   . Diabetes Maternal Grandmother   .       Allergies  Allergen Reactions  . Banana     Medication list has been reviewed and updated.  Current Outpatient Prescriptions on File Prior to Visit  Medication Sig Dispense Refill  . amoxicillin-clavulanate (AUGMENTIN) 875-125 MG per tablet Take 1 tablet by mouth 2 (two) times daily. 20 tablet 0  . ipratropium (ATROVENT) 0.03 % nasal spray Place 2 sprays into both nostrils 2 (two) times daily. (Patient not taking: Reported on 10/08/2014) 30 mL 0   No current facility-administered medications on file prior to visit.    Review of Systems:  As per HPI, otherwise negative.    Physical Examination: Filed Vitals:   10/15/14 1052  BP: 118/76  Pulse: 97   Resp: 16   There were no vitals filed for this visit. There is no weight on file to calculate BMI. Ideal Body Weight:    GEN: WDWN, NAD, Non-toxic, Alert & Oriented x 3 HEENT: Atraumatic, Normocephalic.  Ears and Nose: No external deformity. EXTR: No clubbing/cyanosis/edema NEURO: Normal gait.  PSYCH: Normally interactive. Conversant. Not depressed or anxious appearing.  Calm demeanor.  Well healed   Assessment and Plan:  Suture removal  Signed,  Phillips OdorJeffery Martin Belling, MD

## 2015-03-09 ENCOUNTER — Ambulatory Visit (INDEPENDENT_AMBULATORY_CARE_PROVIDER_SITE_OTHER): Payer: 59 | Admitting: Physician Assistant

## 2015-03-09 VITALS — BP 120/74 | HR 61 | Temp 98.5°F | Resp 18 | Ht 69.5 in | Wt 164.0 lb

## 2015-03-09 DIAGNOSIS — Z111 Encounter for screening for respiratory tuberculosis: Secondary | ICD-10-CM | POA: Diagnosis not present

## 2015-03-09 NOTE — Progress Notes (Signed)

## 2015-03-09 NOTE — Progress Notes (Signed)
   Subjective:    Patient ID: Christian Farmer, male    DOB: [birth year], 29 y.o.   MRN: [id number]  Chief Complaint  Patient presents with  . Immunizations    ppd    Medications, allergies, past medical history, surgical history, family history, social history and problem list reviewed and updated.  HPI  [age] yom presents needing ppd placed.   Will be starting [employer] in the fall. He is utd on immunizations but needs ppd placement. He brings a form with him that states he will need a negative ppd followed by a 2nd negative ppd placed 1-3 weeks later.   TB questionnaire negative.   Review of Systems Neg for fevers, chills, night sweats, cough, wt loss.     Objective:   Physical Exam  Constitutional: He is oriented to person, place, and time. He appears well-developed and well-nourished.  Neurological: He is alert and oriented to person, place, and time.      Assessment & Plan:   [age] yom presents needing ppd placed.   PPD screening test - Plan: TB Skin Test --placed, rtc 48-72 hrs  Donnajean Lopesodd M. Lauree Yurick, PA-C Physician Assistant-Certified [facility]  03/09/2015 11:12 AM

## 2015-03-12 ENCOUNTER — Ambulatory Visit (INDEPENDENT_AMBULATORY_CARE_PROVIDER_SITE_OTHER): Payer: 59 | Admitting: *Deleted

## 2015-03-12 DIAGNOSIS — Z111 Encounter for screening for respiratory tuberculosis: Secondary | ICD-10-CM

## 2015-03-12 DIAGNOSIS — Z7689 Persons encountering health services in other specified circumstances: Secondary | ICD-10-CM

## 2015-03-12 LAB — TB SKIN TEST
INDURATION: 6 mm
TB Skin Test: NEGATIVE

## 2015-03-12 NOTE — Progress Notes (Signed)
Had Deliah BostonMichael Clark, PA looked at the injection site, left forearm.  Negative and 6 mm

## 2015-03-16 ENCOUNTER — Ambulatory Visit (INDEPENDENT_AMBULATORY_CARE_PROVIDER_SITE_OTHER): Payer: 59 | Admitting: Physician Assistant

## 2015-03-16 VITALS — BP 120/78 | HR 77 | Temp 98.2°F | Resp 16 | Ht 69.0 in | Wt 167.6 lb

## 2015-03-16 DIAGNOSIS — Z111 Encounter for screening for respiratory tuberculosis: Secondary | ICD-10-CM

## 2015-03-16 NOTE — Progress Notes (Signed)
Patient here for 2nd TB skin test placement only.  He is to RTC in 48-72 hours for read.  Deliah Boston, MS, PA-C   10:54 AM, 03/16/2015

## 2015-03-16 NOTE — Progress Notes (Signed)
TB SCREENING in patient with history of POSITIVE reaction to TB Skin Test  Has the patient experienced any of the following in the last 12 months? Unexplained productive cough No. Unexplained weight loss No. Unexplained appetite loss No. Unexplained fever No.  Night sweats No. Shortness of breath No.   Chest pain No. Increased fatigue No. Exposure to Tuberculosis No. Development of Diabetes No.  Tuberculosis Risk Questionnaire  1. No Were you born outside the [place] in one of the following parts of the world: [place], [place], [place], [place] or [place]?    2. No Have you traveled outside the [place] and lived for more than one month in one of the following parts of the world: [place], [place], [place], [place] or [place]?    3. No Do you have a compromised immune system such as from any of the following conditions:HIV/AIDS, organ or bone marrow transplantation, diabetes, immunosuppressive medicines (e.g. Prednisone, Remicaide), leukemia, lymphoma, cancer of the head or neck, gastrectomy or jejunal bypass, end-stage renal disease (on dialysis), or silicosis?     4. 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    5. No Have you ever: injected illegal drugs, used crack cocaine, lived in a homeless shelter  or been in jail or prison?     6. No Have you ever been exposed to anyone with infectious tuberculosis?

## 2015-03-16 NOTE — Progress Notes (Deleted)
   Subjective:    Patient ID: Christian Farmer, male    DOB: 1986-08-02, 29 y.o.   MRN: [id number]  HPI    Review of Systems     Objective:   Physical Exam        Assessment & Plan:

## 2015-03-18 ENCOUNTER — Ambulatory Visit (INDEPENDENT_AMBULATORY_CARE_PROVIDER_SITE_OTHER): Payer: 59 | Admitting: *Deleted

## 2015-03-18 DIAGNOSIS — Z111 Encounter for screening for respiratory tuberculosis: Secondary | ICD-10-CM

## 2015-03-18 DIAGNOSIS — Z7689 Persons encountering health services in other specified circumstances: Secondary | ICD-10-CM

## 2015-03-18 LAB — TB SKIN TEST
Induration: 0 mm
TB SKIN TEST: NEGATIVE
